# Patient Record
Sex: Female | Born: 1960 | Race: White | Hispanic: No | Marital: Married
Health system: Southern US, Community
[De-identification: ages and names within clinical notes are randomized; demographics above are authoritative.]

---

## 1998-03-15 ENCOUNTER — Encounter: Payer: Self-pay | Admitting: Specialist

## 1998-03-15 ENCOUNTER — Ambulatory Visit (HOSPITAL_COMMUNITY): Admission: RE | Admit: 1998-03-15 | Discharge: 1998-03-15 | Payer: Self-pay | Admitting: Specialist

## 1999-08-18 ENCOUNTER — Encounter: Payer: Self-pay | Admitting: Specialist

## 1999-08-18 ENCOUNTER — Ambulatory Visit (HOSPITAL_COMMUNITY): Admission: RE | Admit: 1999-08-18 | Discharge: 1999-08-18 | Payer: Self-pay | Admitting: Specialist

## 1999-09-04 ENCOUNTER — Encounter: Payer: Self-pay | Admitting: Specialist

## 1999-09-04 ENCOUNTER — Ambulatory Visit (HOSPITAL_COMMUNITY): Admission: RE | Admit: 1999-09-04 | Discharge: 1999-09-04 | Payer: Self-pay | Admitting: Specialist

## 1999-09-18 ENCOUNTER — Ambulatory Visit (HOSPITAL_COMMUNITY): Admission: RE | Admit: 1999-09-18 | Discharge: 1999-09-18 | Payer: Self-pay | Admitting: Specialist

## 1999-09-18 ENCOUNTER — Encounter: Payer: Self-pay | Admitting: Specialist

## 2000-09-21 ENCOUNTER — Encounter: Payer: Self-pay | Admitting: Specialist

## 2000-09-21 ENCOUNTER — Encounter: Admission: RE | Admit: 2000-09-21 | Discharge: 2000-09-21 | Payer: Self-pay | Admitting: Specialist

## 2002-11-16 ENCOUNTER — Encounter: Payer: Self-pay | Admitting: Specialist

## 2002-11-16 ENCOUNTER — Encounter: Admission: RE | Admit: 2002-11-16 | Discharge: 2002-11-16 | Payer: Self-pay | Admitting: Specialist

## 2005-01-28 ENCOUNTER — Ambulatory Visit (HOSPITAL_COMMUNITY): Admission: RE | Admit: 2005-01-28 | Discharge: 2005-01-29 | Payer: Self-pay | Admitting: Orthopaedic Surgery

## 2005-04-09 ENCOUNTER — Encounter: Admission: RE | Admit: 2005-04-09 | Discharge: 2005-04-09 | Payer: Self-pay | Admitting: Orthopaedic Surgery

## 2005-11-12 ENCOUNTER — Encounter: Admission: RE | Admit: 2005-11-12 | Discharge: 2005-11-12 | Payer: Self-pay | Admitting: Orthopaedic Surgery

## 2006-04-01 ENCOUNTER — Inpatient Hospital Stay (HOSPITAL_COMMUNITY): Admission: RE | Admit: 2006-04-01 | Discharge: 2006-04-02 | Payer: Self-pay | Admitting: Orthopaedic Surgery

## 2007-06-23 ENCOUNTER — Encounter: Admission: RE | Admit: 2007-06-23 | Discharge: 2007-06-23 | Payer: Self-pay | Admitting: Orthopaedic Surgery

## 2008-11-26 ENCOUNTER — Ambulatory Visit (HOSPITAL_COMMUNITY): Admission: RE | Admit: 2008-11-26 | Discharge: 2008-11-26 | Payer: Self-pay | Admitting: Specialist

## 2009-03-05 ENCOUNTER — Encounter: Admission: RE | Admit: 2009-03-05 | Discharge: 2009-03-05 | Payer: Self-pay | Admitting: Specialist

## 2009-04-02 ENCOUNTER — Encounter: Admission: RE | Admit: 2009-04-02 | Discharge: 2009-04-02 | Payer: Self-pay | Admitting: Specialist

## 2010-06-06 LAB — CBC
HCT: 37 % (ref 36.0–46.0)
Hemoglobin: 13.1 g/dL (ref 12.0–15.0)
MCHC: 35.3 g/dL (ref 30.0–36.0)
MCV: 93.6 fL (ref 78.0–100.0)
Platelets: 227 10*3/uL (ref 150–400)
RBC: 3.95 MIL/uL (ref 3.87–5.11)
RDW: 13 % (ref 11.5–15.5)
WBC: 9.7 10*3/uL (ref 4.0–10.5)

## 2010-06-06 LAB — BASIC METABOLIC PANEL
BUN: 11 mg/dL (ref 6–23)
CO2: 32 mEq/L (ref 19–32)
Calcium: 8.9 mg/dL (ref 8.4–10.5)
Chloride: 103 mEq/L (ref 96–112)
Creatinine, Ser: 0.77 mg/dL (ref 0.4–1.2)
GFR calc Af Amer: >60 mL/min (ref >60)
GFR calc non Af Amer: >60 mL/min (ref >60)
Glucose, Bld: 108 mg/dL — ABNORMAL HIGH (ref 70–99)
Potassium: 3.9 mEq/L (ref 3.5–5.1)
Sodium: 142 mEq/L (ref 135–145)

## 2010-06-06 LAB — GLUCOSE, CAPILLARY
Glucose-Capillary: 92 mg/dL (ref 70–99)
Glucose-Capillary: 99 mg/dL (ref 70–99)

## 2010-07-18 NOTE — Op Note (Signed)
[name], Fischer                 ACCOUNT NO.:  [id number]   MEDICAL RECORD NO.:  [id number]          PATIENT TYPE:  OIB   LOCATION:  5037                         FACILITY:  [facility]   PHYSICIAN:  Sharolyn Douglas, M.D.        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   PROCEDURES:  1.  Anterior cervical diskectomy C5-6, C6-7  2.  Anterior cervical arthrodesis C5-6 and C6-7 with placement of two      allograft prosthesis spacers.  3.  Anterior cervical plating from C5-C7 using the Abbott spine system.   SURGEON:  Sharolyn Douglas, M.D.   ASSISTANT:  Verlin Fester, P.A.   ANESTHESIA:  General endotracheal.   COMPLICATIONS:  None.   ESTIMATED BLOOD LOSS:  Minimal.   INDICATIONS:  The patient is a pleasant 50 year old female with severe neck  and left upper extremity radiculopathy. She has been refractory to  conservative treatment. Her plain radiographs show degenerative changes. Her  MRI scan shows disk osteophyte complex at C5-6, C6-7 with spinal stenosis  and foraminal narrowing. At this time, she has elected to undergo ACDF in  hopes of improving her symptoms. Risks and benefits were reviewed.   PROCEDURE:  The patient was identified holding area and taken to the  operating room, underwent general endotracheal anesthesia without  difficulty, given prophylactic IV antibiotics. Carefully positioned supine  with the Mayfield head rest, 5 pounds of halter traction applied. All bony  prominences padded. Face and eyes protected all times. Neck prepped and  draped in the usual sterile fashion. Small transverse incision made level of  the cricoid cartilage and a natural skin crease. Dissection was carried  sharply through the platysma interval between the SCM and strap muscles  medially was developed down to the prevertebral space. The  esophagus,  trachea, carotid sheath identified and protected at all times. C5-6 disc  space was identified by the anterior osteophyte, spinal needle placed, x-ray  taken to confirm level. Longus coli muscle elevated out over the C5-6, C6-7  disk spaces bilaterally. Deep Shadowline retractor placed. Leica microscope  was draped and brought into the field. Caspar distraction pins placed in C5,  C6 and C7 vertebral bodies. Gentle distraction applied. Diskectomies were  carried back to the posterior longitudinal ligament. The disk material was  found to be degenerative at both levels. The cartilaginous endplates were  scraped clean using a curette. High-speed bur used to remove the posterior  vertebral margins and uncovertebral spurs. A 2 mm Kerrison punch used to  take down the posterior longitudinal ligament and complete foraminotomies  bilaterally. We felt we had a good decompression at both the C5-6 and C6-7  levels. Hemostasis was achieved. We then placed an 8 mm allograft prosthesis  spacer which was packed with local bone graft obtained from the drill  shavings into the C5-6 interspace. This was countersunk 1 mm. A similar  procedure was carried out at C6-7,  this time utilizing a 7 mm allograft  spacer. At this point, attention was turned to placing an anterior cervical  plate from C5 to C7. The plate measured 42 mm. We utilized six 12 mm screws.  We had adequate screw purchase, although the bone quality was somewhat soft.  We ensured that the locking mechanism engaged. The wound was irrigated. The  esophagus, trachea, carotid sheath were inspected. There were no apparent  injuries. Hemostasis was achieved. The deep Penrose drain was left in place.  The platysma closed with interrupted 2-0 Vicryl suture. Subcutaneous layer  closed with interrupted 3-0 Vicryl suture,  subcuticular layer closed with a  4-0 Vicryl suture. Benzoin and Steri-Strips placed. Sterile dressing  applied. Soft  collar placed. The patient was extubated without difficulty  and transferred to recovery room. Intraoperative x-ray confirmed acceptable  positioning of the cervical plate and interbody spacers at C5-6 and C6-7  levels.      Sharolyn Douglas, M.D.  Electronically Signed     MC/MEDQ  D:  01/28/2005  T:  01/29/2005  Job:  [id number]

## 2010-07-18 NOTE — H&P (Signed)
Alyssa Fischer, Alyssa Fischer                ACCOUNT NO.:  [id number]   MEDICAL RECORD NO.:  [id number]           PATIENT TYPE:   LOCATION:                                 FACILITY:   PHYSICIAN:  Verlin Fester, P.A.    DATE OF BIRTH:  1960-12-30   DATE OF ADMISSION:  04/01/2006  DATE OF DISCHARGE:                              HISTORY & PHYSICAL   CHIEF COMPLAINT:  Neck and left upper extremity pain.   HISTORY OF PRESENT ILLNESS:  The patient is a 50 year old female who has  had a long history of problems in her neck extending into her left upper  extremity.  She was found to have pseudoarthrosis and a previous fusion  C5 to C7.  Secondary to this, it was thought her best course of  management would be a C5 to C7 posterior cervical fusion.  Risks and  benefits of the surgery were discussed with the patient by Dr. Noel Gerold.  She indicated understanding and opted to proceed.   ALLERGIES:  MORPHINE CAUSES NAUSEA AND VOMITING.  PENICILLIN CAUSES A  RASH.   MEDICATIONS:  Flexeril and Norco as needed, lisinopril 10 mg daily,  hydrochlorothiazide 25 mg daily, Chlorex-A as needed, Lescol XL 80 mg  daily.   PAST MEDICAL HISTORY:  Hypertension and allergies.   PAST SURGICAL HISTORY:  Shoulder surgery.   SOCIAL HISTORY:  The patient smokes a half pack of cigarettes per day.  Denies alcohol use.  She is married.   FAMILY MEDICAL HISTORY:  Noncontributory.   REVIEW OF SYSTEMS:  Negative.   PHYSICAL EXAM:  VITAL SIGNS:  Pulse is 76 and regular.  Respirations are  16 and unlabored.  GENERAL APPEARANCE:  The patient is a 50 year old white female who is  alert and oriented, in no acute distress.  She is well nourished, well  groomed.  Appears stated age.  Pleasant and cooperative to exam.  HEENT:  Head is normocephalic, atraumatic.  Pupils equal, round, and  reactive to light.  Extraocular movements intact.  Nares are patent.  Pharynx is clear.  NECK:  Soft to palpation.  No lymphadenopathy,  thyromegaly, or bruits  appreciated.  CHEST:  Clear to auscultation bilaterally.  No rales, rhonchi, stridor,  wheezing, or friction rubs.  BREASTS:  Not pertinent.  Not performed.  HEART:  S1 and S2.  Regular rate and rhythm.  No murmurs, gallops, or  rubs noted.  ABDOMEN:  Soft to palpation, nontender, nondistended.  No organomegaly  noted.  GU:  Not pertinent.  Not performed.  EXTREMITIES:  As per HPI.  SKIN:  Intact without lesions or rashes.   IMPRESSION:  1. 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  2. Hypertension.  3. Allergies.   PLAN:  Admit to [facility] on April 01, 2006, for a posterior  cervical fusion from C5 to C7.  Surgeon will be Dr. Noel Gerold.      Verlin Fester, P.A.     CM/MEDQ  D:  03/23/2006  T:  03/23/2006  Job:  [id number]   cc:   Sharolyn Douglas, M.D.

## 2010-07-18 NOTE — Op Note (Signed)
Alyssa Fischer, GASS                 ACCOUNT NO.:  [id number]   MEDICAL RECORD NO.:  [id number]          PATIENT TYPE:  INP   LOCATION:  2550                         FACILITY:  [facility]   PHYSICIAN:  Sharolyn Douglas, M.D.        DATE OF BIRTH:  22-Jan-1961   DATE OF PROCEDURE:  04/01/2006  DATE OF DISCHARGE:                               OPERATIVE REPORT   DIAGNOSIS:  1. C5-C6 and C6-C7 pseudoarthrosis status post anterior cervical      discectomy and fusion.  2. Neck and upper extremity pain.   PROCEDURE:  1. C5-C6 and C6-C7 bilateral foraminotomies.  2. C5-C6 and C6-C7 posterior spinal fusion.  3. Segmental instrumentation with lateral mass screws C5 to C7 using      the Abbott spine system.  4. Left posterior iliac crest bone graft supplemented with 15 mL of      Grafton allograft.   SURGEON:  Sharolyn Douglas, M.D.   ASSISTANTJill Side Mahar, P.A.-C.   ANESTHESIA:  General endotracheal anesthesia.   ESTIMATED BLOOD LOSS:  100 mL.   COMPLICATIONS:  None.   COUNTS:  Needle and sponge counts correct.   INDICATIONS:  The patient is a pleasant lady with persistent neck and  upper extremity pain, history of previous three level ACDF at C5-C6 and  C6-C7.  She did well initially and then redeveloped pain.  Her imaging  studies show pseudoarthrosis at both levels.  She now presents for  posterior decompression and fusion with iliac crest bone graft and  instrumentation.  The risks, benefits, and alternatives were reviewed.   DESCRIPTION OF PROCEDURE:  After informed consent, she was taken to the  operating room where she underwent general endotracheal anesthesia  without difficulty and was given prophylactic IV antibiotics.  Neural  monitoring was established in the form of SSEPs and upper extremity  EMGs.  The Mayfield tongs were placed in the usual sterile fashion.  The  patient was turned prone onto the Wilson frame.  The neck was placed in  the neutral position and the Mayfield was  attached to the operating room  table.  The neck was then prepped and draped in the usual sterile  fashion.  An x-ray was taken before making a skin incision to help  localize.   An incision was made from C4 down to C7.  Dissection was carried through  the nuchal ligament.  Subperiosteal exposure carried out to the lateral  edges of the lateral masses bilaterally.  The C5-C6 and C6-C7 joints  were identified and evaluated.  There was clear motion occurring at both  levels consistent with a diagnosis of pseudoarthrosis.  Interoperative x-  ray was taken to confirm the levels.  We then performed bilateral  foraminotomies at C5-C6 and C6-C7 using a high-speed bur and micro-  Kerrison punch.  A blunt probe was passed out the foramen confirming  that they were patent.   We then turned our attention to obtaining bone graft from the left  posterior iliac crest.  A 2 cm incision was made and dissection was  carried  to the fascia.  The PSIS was removed with a Leksell rongeur and  then curets were used to scrape out bone from between the iliac tables.  The bone was soft and the hip was osteoporotic.  Once we had collected  enough bone to place in the facet joints, Gelfoam was left in the donor  site.  The deep fascia was closed with #1 Vicryl suture, the  subcutaneous layer closed with 2-0 Vicryl,  and then a 3-0 subcuticular  Vicryl in the skin edges.  Dermabond was applied.   We then turned our attention back to the posterior cervical spine.  A  high speed bur was used to decorticate the facet joints of C5-C6 and C6-  C7 bilaterally.  The bone graft which was obtained from the iliac crest  was tightly packed into the facet joints.  We then drilled four lateral  mass screws at C5, C6, and C7 using the standard technique.  We utilized  the unicortical approach.  We confirmed that there were no breeches use  the ball tipped feeler.  The screw holes were tapped and then we placed  3.5 by 10 mm  screws at C5, C6, and C7 bilaterally.  The bone quality was  good.  The screw purchase was excellent.  Each screw was stimulated  using triggered EMGs and there were no deleterious changes.  We then  placed short segment titanium rods into the polyaxial screw heads and  tightened the locking caps..  We then placed an additional 15 mL of  Grafton allograft over the lateral masses which had been decorticated.  Hemostasis was achieved.   The wound was irrigated and the deep fascia was closed with a running #1  Vicryl suture.  The subcutaneous layer was closed with 0 Vicryl and 2-0  Vicryl followed by a running 3-0 subcuticular Vicryl suture in the skin  edges.  Dermabond was applied.  A sterile dressing was placed.  The  patient was turned supine, extubated without difficulty, and a cervical  collar was placed.  She was transferred to recovery in stable condition.  It should be noted my assistant, [employer], P.A.-C., was present  throughout the procedure including positioning, the exposure, the  decompression, the instrumentation and fusion.  She also assisted with  wound closure.  It should be noted that the Mayfield tongs were removed  at the completion of the procedure and the pin sites were intact and  there was no bleeding.      Sharolyn Douglas, M.D.  Electronically Signed     MC/MEDQ  D:  04/01/2006  T:  04/01/2006  Job:  [id number]

## 2010-12-19 IMAGING — CR DG CHEST 2V
2 series · 2 of 2 positions shown · non-contrast
Comparison: 03/30/2006 study

CLINICAL DATA: History of preoperative cardiopulmonary evaluation.

CHEST - 2 VIEW

[w chest pa]
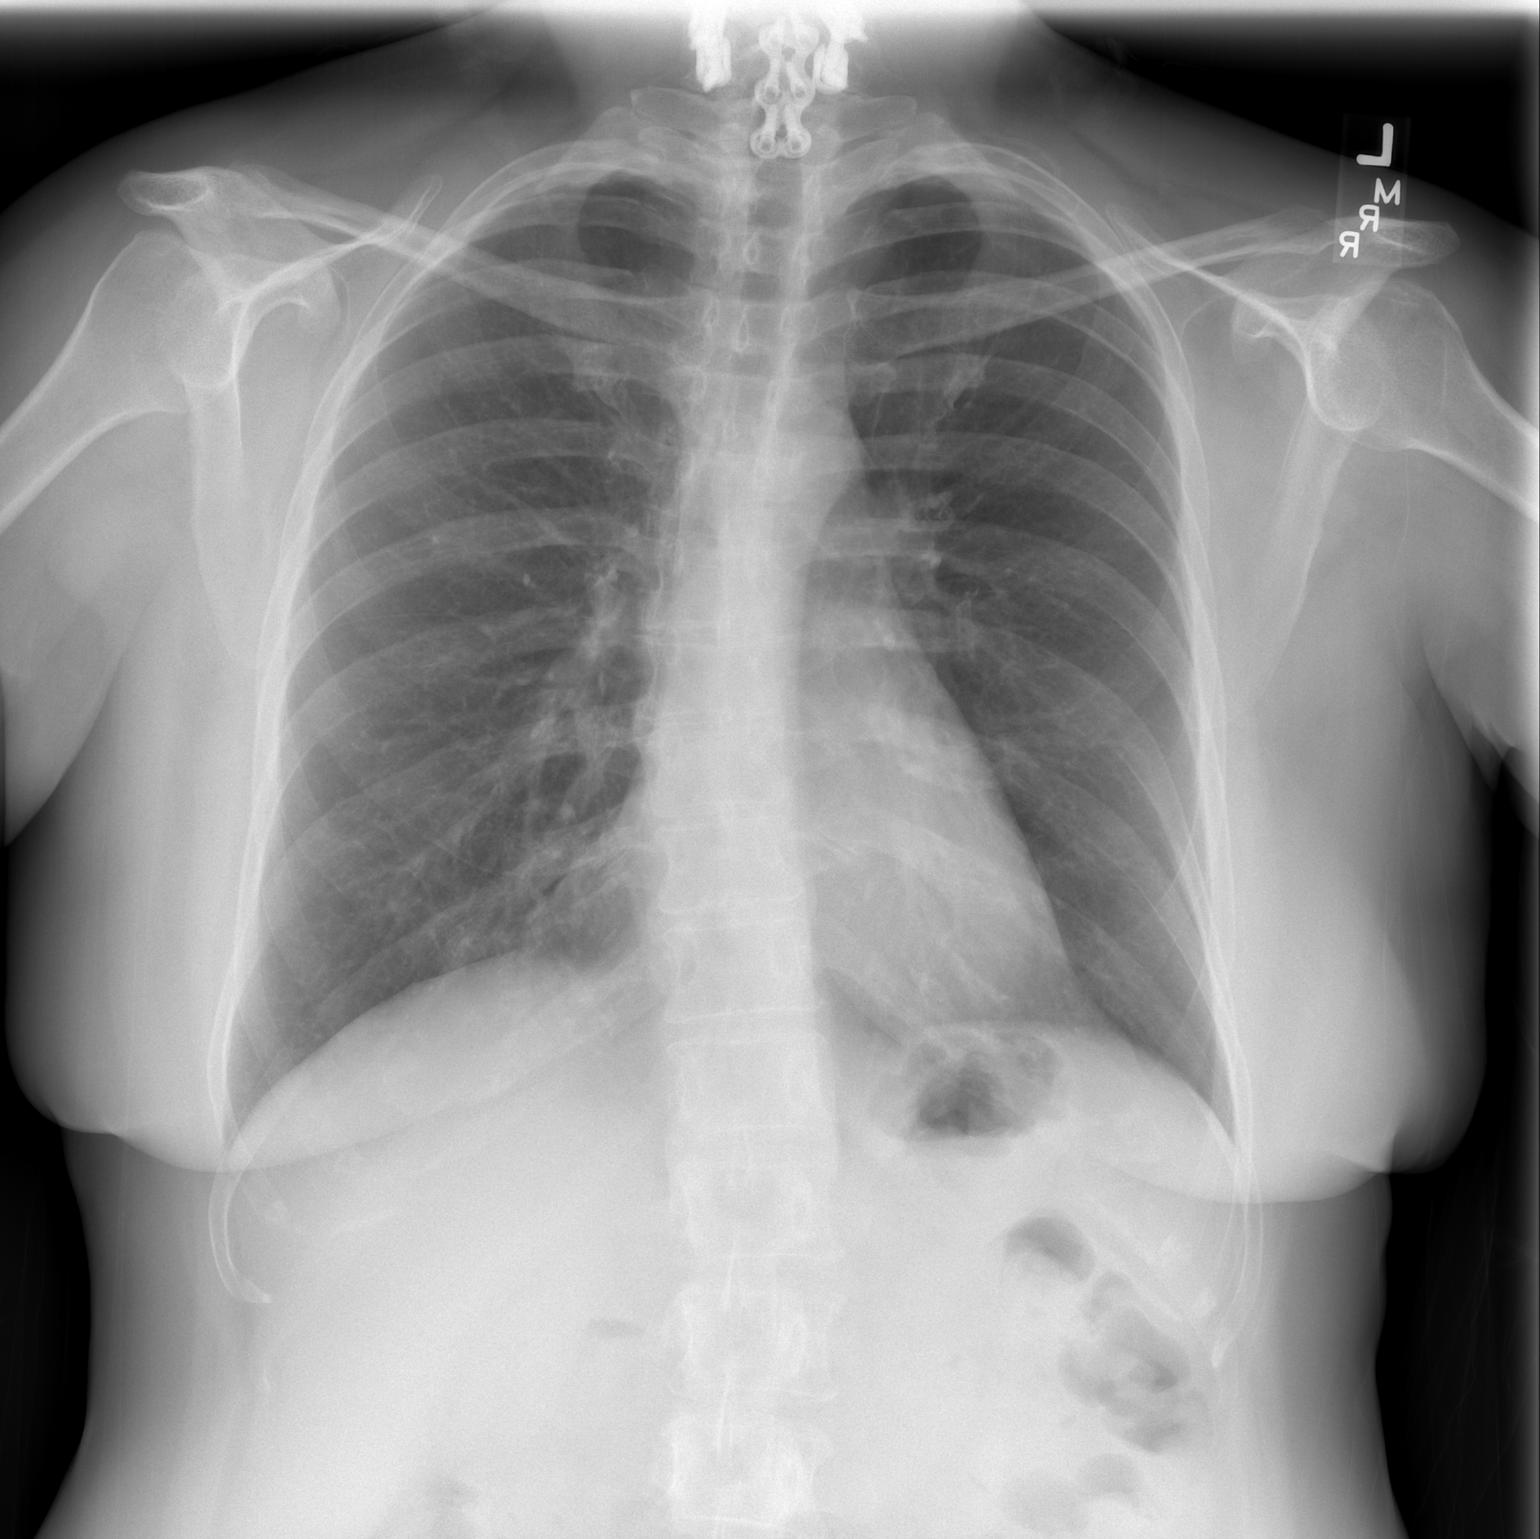

[w chest lat]
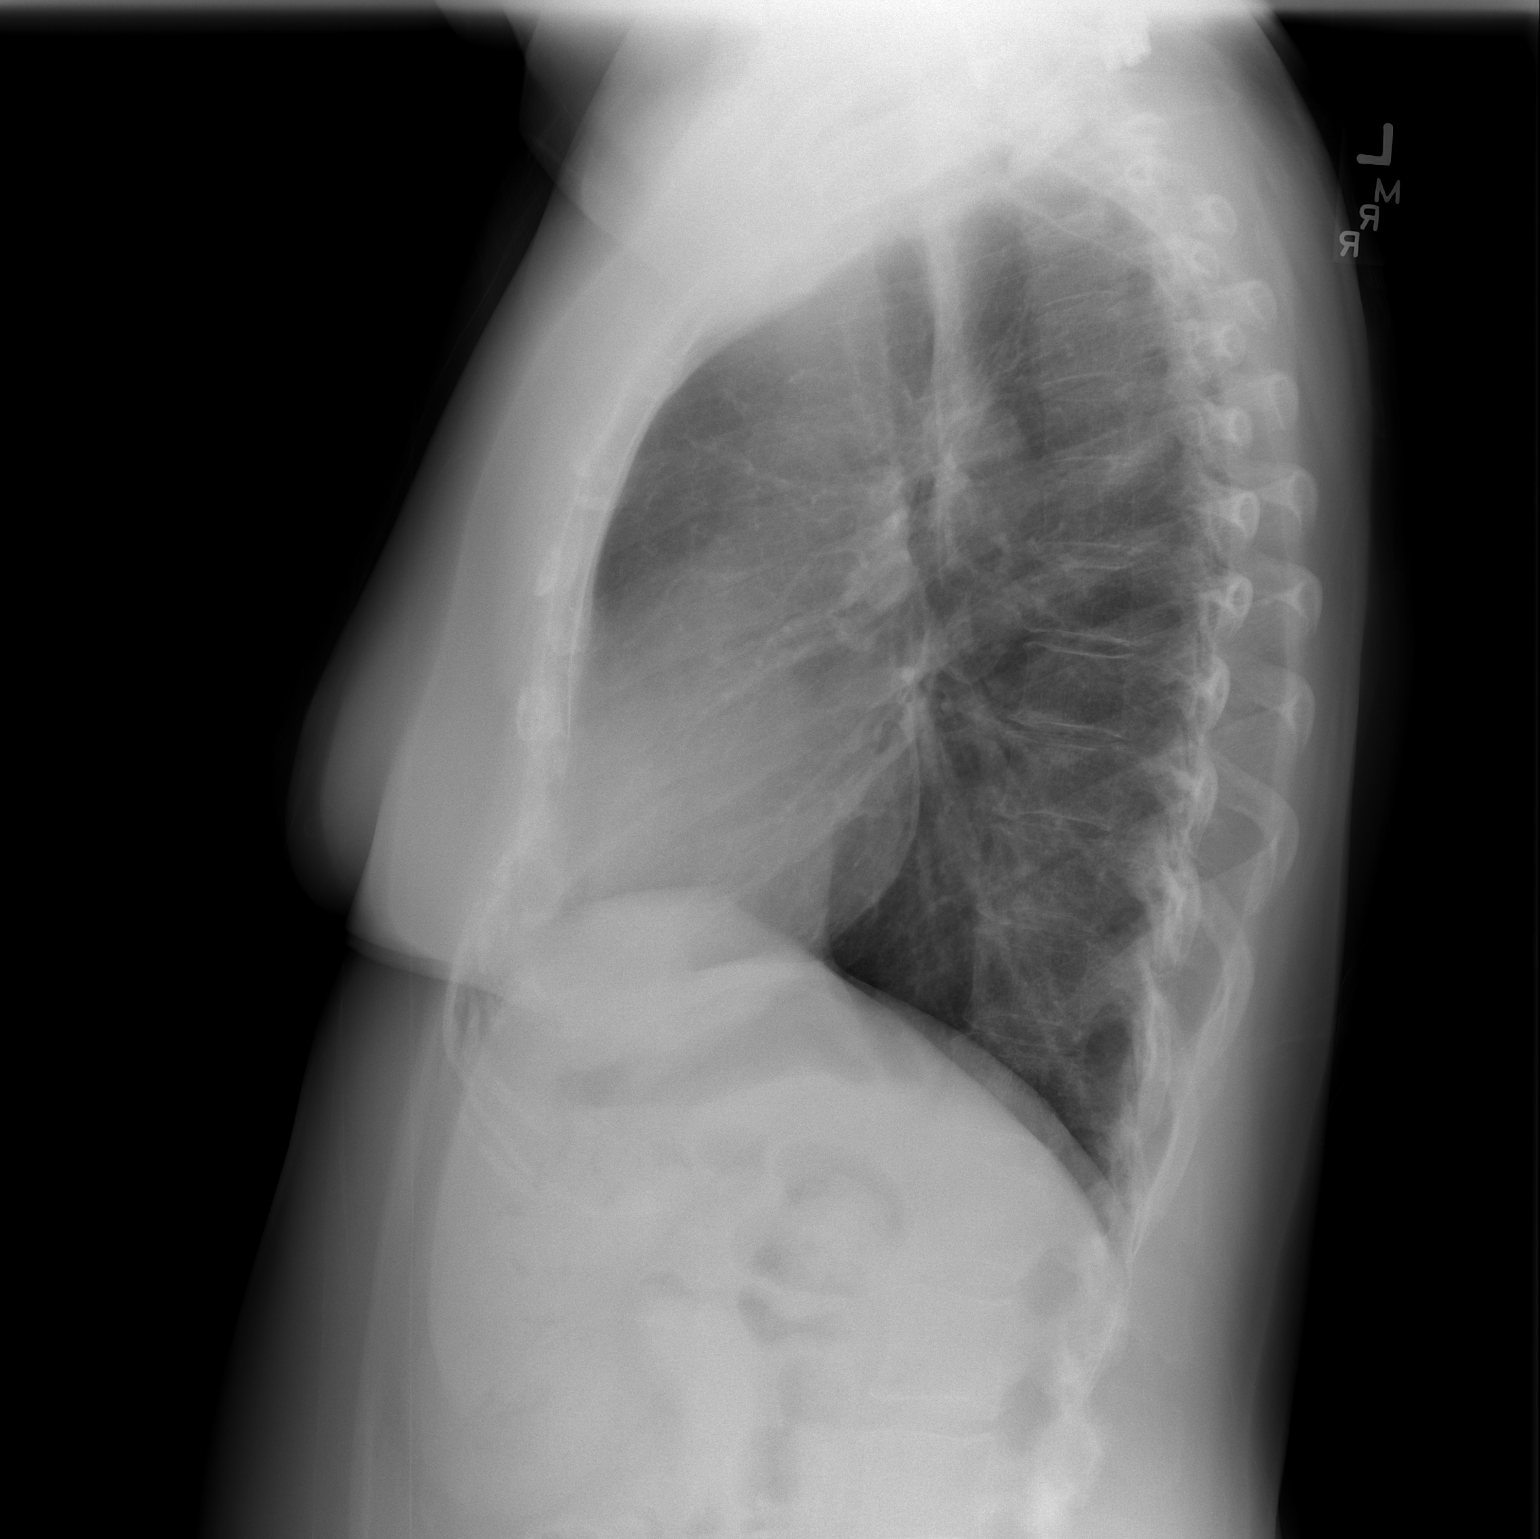

[2 of 2 positions shown; findings below may reference images not displayed]

FINDINGS: Previous cervical surgery has been performed with
hardware seen in the lower neck. The cardiac silhouette is normal
size and shape. No pleural abnormality is evident. The lungs are
well aerated and free of infiltrates. There is slight scoliosis
convexity to the right.
IMPRESSION: No acute or active process is identified.

## 2011-10-27 ENCOUNTER — Other Ambulatory Visit: Payer: Self-pay | Admitting: Orthopaedic Surgery

## 2011-10-27 DIAGNOSIS — M542 Cervicalgia: Secondary | ICD-10-CM

## 2011-11-07 ENCOUNTER — Ambulatory Visit
Admission: RE | Admit: 2011-11-07 | Discharge: 2011-11-07 | Disposition: A | Discharge status: Home / Self Care | Payer: Medicare Other | Source: Ambulatory Visit | Attending: Orthopaedic Surgery | Admitting: Orthopaedic Surgery

## 2011-11-07 DIAGNOSIS — M542 Cervicalgia: Secondary | ICD-10-CM

## 2012-06-22 ENCOUNTER — Other Ambulatory Visit: Payer: Self-pay | Admitting: Orthopaedic Surgery

## 2012-06-22 DIAGNOSIS — M25572 Pain in left ankle and joints of left foot: Secondary | ICD-10-CM

## 2012-06-29 ENCOUNTER — Ambulatory Visit
Admission: RE | Admit: 2012-06-29 | Discharge: 2012-06-29 | Disposition: A | Discharge status: Home / Self Care | Payer: Medicare Other | Source: Ambulatory Visit | Attending: Orthopaedic Surgery | Admitting: Orthopaedic Surgery

## 2012-06-29 DIAGNOSIS — M25572 Pain in left ankle and joints of left foot: Secondary | ICD-10-CM

## 2012-11-23 ENCOUNTER — Other Ambulatory Visit: Payer: Self-pay | Admitting: Orthopaedic Surgery

## 2012-11-23 DIAGNOSIS — M549 Dorsalgia, unspecified: Secondary | ICD-10-CM

## 2012-12-06 ENCOUNTER — Ambulatory Visit
Admission: RE | Admit: 2012-12-06 | Discharge: 2012-12-06 | Disposition: A | Discharge status: Home / Self Care | Payer: Medicare HMO | Source: Ambulatory Visit | Attending: Orthopaedic Surgery | Admitting: Orthopaedic Surgery

## 2012-12-06 DIAGNOSIS — M549 Dorsalgia, unspecified: Secondary | ICD-10-CM

## 2013-02-10 ENCOUNTER — Other Ambulatory Visit: Payer: Self-pay | Admitting: Rehabilitation

## 2013-02-10 DIAGNOSIS — M25511 Pain in right shoulder: Secondary | ICD-10-CM

## 2013-02-20 ENCOUNTER — Ambulatory Visit
Admission: RE | Admit: 2013-02-20 | Discharge: 2013-02-20 | Disposition: A | Discharge status: Home / Self Care | Payer: Medicare Other | Source: Ambulatory Visit | Attending: Rehabilitation | Admitting: Rehabilitation

## 2013-02-20 DIAGNOSIS — M25511 Pain in right shoulder: Secondary | ICD-10-CM

## 2013-07-10 ENCOUNTER — Other Ambulatory Visit: Payer: Self-pay | Admitting: Rehabilitation

## 2013-07-10 DIAGNOSIS — M25511 Pain in right shoulder: Secondary | ICD-10-CM

## 2013-07-19 ENCOUNTER — Ambulatory Visit
Admission: RE | Admit: 2013-07-19 | Discharge: 2013-07-19 | Disposition: A | Discharge status: Home / Self Care | Payer: Medicare HMO | Source: Ambulatory Visit | Attending: Rehabilitation | Admitting: Rehabilitation

## 2013-07-19 DIAGNOSIS — M25511 Pain in right shoulder: Secondary | ICD-10-CM

## 2015-01-22 ENCOUNTER — Other Ambulatory Visit: Payer: Self-pay | Admitting: Orthopaedic Surgery

## 2015-01-22 DIAGNOSIS — M47896 Other spondylosis, lumbar region: Secondary | ICD-10-CM

## 2015-08-06 ENCOUNTER — Other Ambulatory Visit: Payer: Self-pay | Admitting: Orthopaedic Surgery

## 2015-08-06 DIAGNOSIS — M47896 Other spondylosis, lumbar region: Secondary | ICD-10-CM

## 2015-08-26 ENCOUNTER — Ambulatory Visit
Admission: RE | Admit: 2015-08-26 | Discharge: 2015-08-26 | Disposition: A | Discharge status: Home / Self Care | Payer: Medicare HMO | Source: Ambulatory Visit | Attending: Orthopaedic Surgery | Admitting: Orthopaedic Surgery

## 2015-08-26 DIAGNOSIS — M47896 Other spondylosis, lumbar region: Secondary | ICD-10-CM
